# Patient Record
Sex: Male | Born: 1980 | Race: White | Hispanic: No | Marital: Married
Health system: Southern US, Community
[De-identification: ages and names within clinical notes are randomized; demographics above are authoritative.]

## PROBLEM LIST (undated history)

## (undated) DIAGNOSIS — I10 Essential (primary) hypertension: Secondary | ICD-10-CM

---

## 2019-12-09 ENCOUNTER — Emergency Department (HOSPITAL_COMMUNITY): Payer: Managed Care, Other (non HMO)

## 2019-12-09 ENCOUNTER — Other Ambulatory Visit: Payer: Self-pay

## 2019-12-09 ENCOUNTER — Emergency Department (HOSPITAL_COMMUNITY)
Admission: EM | Admit: 2019-12-09 | Discharge: 2019-12-09 | Disposition: A | Discharge status: Home / Self Care | Payer: Managed Care, Other (non HMO) | Attending: Emergency Medicine | Admitting: Emergency Medicine

## 2019-12-09 ENCOUNTER — Encounter (HOSPITAL_COMMUNITY): Payer: Self-pay | Admitting: Emergency Medicine

## 2019-12-09 DIAGNOSIS — N5089 Other specified disorders of the male genital organs: Secondary | ICD-10-CM | POA: Diagnosis present

## 2019-12-09 DIAGNOSIS — N50811 Right testicular pain: Secondary | ICD-10-CM | POA: Diagnosis not present

## 2019-12-09 DIAGNOSIS — N5082 Scrotal pain: Secondary | ICD-10-CM

## 2019-12-09 DIAGNOSIS — I10 Essential (primary) hypertension: Secondary | ICD-10-CM | POA: Diagnosis not present

## 2019-12-09 HISTORY — DX: Essential (primary) hypertension: I10

## 2019-12-09 LAB — URINALYSIS, ROUTINE W REFLEX MICROSCOPIC
Bilirubin Urine: NEGATIVE
Glucose, UA: NEGATIVE mg/dL
Hgb urine dipstick: NEGATIVE
Ketones, ur: NEGATIVE mg/dL
Leukocytes,Ua: NEGATIVE
Nitrite: NEGATIVE
Protein, ur: NEGATIVE mg/dL
Specific Gravity, Urine: 1.018 (ref 1.005–1.030)
pH: 5 (ref 5.0–8.0)

## 2019-12-09 MED ORDER — NAPROXEN 500 MG PO TABS: 500.0000 mg | ORAL_TABLET | Freq: Two times a day (BID) | ORAL | 0 refills | Status: AC

## 2019-12-09 NOTE — ED Provider Notes (Signed)
[facility] DEPT Provider Note   CSN: [id number] Arrival date & time: 12/09/19  1206     History Chief Complaint  Patient presents with  . Testicle Pain    Jeremy Hoffman is a 39 y.o. male.  HPI    39 year old male comes in a chief complaint of scrotal irritation. Patient reports that for the last several weeks has been having some off-and-on irritation over his scrotal region.  He attributed to the discomfort to his exercise.  Over the last 4 to 5 days his symptoms have become more constant and prominent.  His symptoms are worse with any kind of movement.  He has relaxed activity but his symptoms have persisted.  Patient denies any trauma.  There is no UTI-like symptoms.  He has no penile discharge.  Patient has no family history of testicular tumor in the family.   Past Medical History:  Diagnosis Date  . Hypertension     There are no problems to display for this patient.       No family history on file.  Social History   Tobacco Use  . Smoking status: Not on file  Substance Use Topics  . Alcohol use: Not on file  . Drug use: Not on file    Home Medications Prior to Admission medications   Not on File    Allergies    Patient has no known allergies.  Review of Systems   Review of Systems  Constitutional: Positive for activity change.  Gastrointestinal: Negative for abdominal pain, nausea and vomiting.  Genitourinary: Positive for scrotal swelling. Negative for decreased urine volume.  Allergic/Immunologic: Negative for immunocompromised state.  Hematological: Does not bruise/bleed easily.  All other systems reviewed and are negative.   Physical Exam Updated Vital Signs BP (!) 152/101 (BP Location: Left Arm)   Pulse 72   Temp 97.7 F (36.5 C) (Oral)   Resp 19   SpO2 96%   Physical Exam Vitals and nursing note reviewed.  Constitutional:      Appearance: He is well-developed.  HENT:     Head: Atraumatic.    Cardiovascular:     Rate and Rhythm: Normal rate.  Pulmonary:     Effort: Pulmonary effort is normal.  Genitourinary:    Comments: Patient has no significant edema, erythema, induration over the right scrotum.  No signs of inguinal hernia. Musculoskeletal:     Cervical back: Neck supple.  Skin:    General: Skin is warm.  Neurological:     Mental Status: He is alert and oriented to person, place, and time.     ED Results / Procedures / Treatments   Labs (all labs ordered are listed, but only abnormal results are displayed) Labs Reviewed  URINALYSIS, ROUTINE W REFLEX MICROSCOPIC    EKG None  Radiology US SCROTUM W/DOPPLER  Result Date: 12/09/2019 CLINICAL DATA:  39 year old with right scrotal pain for 3 weeks. EXAM: SCROTAL ULTRASOUND DOPPLER ULTRASOUND OF THE TESTICLES TECHNIQUE: Complete ultrasound examination of the testicles, epididymis, and other scrotal structures was performed. Color and spectral Doppler ultrasound were also utilized to evaluate blood flow to the testicles. COMPARISON:  None. FINDINGS: Right testicle Measurements: 4.7 x 2.5 x 2.9 cm. Homogeneous echogenicity. Normal blood flow. No mass or microlithiasis visualized. Left testicle Measurements: 4.5 x 2.3 x 3.2 cm. Homogeneous echogenicity. Normal blood flow. No mass or microlithiasis visualized. Right epididymis:  Normal in size and appearance.  No hyperemia. Left epididymis: Normal in size. There is a 1.3 cm  testicular head cyst. No hyperemia. Hydrocele:  Small bilateral. Varicocele: None by size criteria. There is prominent left para scrotal vascularity without frank varicocele. Pulsed Doppler interrogation of both testes demonstrates normal low resistance arterial and venous waveforms bilaterally. IMPRESSION: 1. Normal sonographic appearance of both testis. No testicular torsion. 2. Small bilateral hydroceles. 3. No definite varicocele, however there is prominent para scrotal vascularity on the left with Valsalva,  of doubtful clinical significance in the setting of right scrotal pain. No testicular or epididymal hyperemia. Electronically Signed   By: Narda Rutherford M.D.   On: 12/09/2019 14:02    Procedures Procedures (including critical care time)  Medications Ordered in ED Medications - No data to display  ED Course  I have reviewed the triage vital signs and the nursing notes.  Pertinent labs & imaging results that were available during my care of the patient were reviewed by me and considered in my medical decision making (see chart for details).    MDM Rules/Calculators/A&P                      39 year old comes in a chief complaint of scrotal pain. Pain has been present for the last several weeks and has intensified over the last few days. Exam does not reveal any evidence of hernia, abscess/infection. UA is clean.  Ultrasound ordered and it does not reveal any evidence of epididymitis.  Patient is in a monogamous relationship with his wife, therefore doubt STDs.  We will advise him to follow-up with PCP or urologist for further evaluation.  NSAID started.  Final Clinical Impression(s) / ED Diagnoses Final diagnoses:  Scrotal pain    Rx / DC Orders ED Discharge Orders    None       Derwood Kaplan, MD 12/09/19 1514

## 2019-12-09 NOTE — ED Triage Notes (Signed)
Patient reports right testicle pain x3 weeks. Reports exercising daily. States pain radiates down right left intermittently. Seen at [facility] and sent for further evaluation. Denies urinary symptoms and penile discharge.

## 2019-12-09 NOTE — Discharge Instructions (Signed)
We saw you in the ER for testicle/scrotal pain. All the results in the ER are normal, labs and imaging. We are not sure what is causing your symptoms. The workup in the ER is not complete, and is limited to screening for life threatening and emergent conditions only, so please see a primary care doctor or urologist for further evaluation.

## 2021-03-04 IMAGING — US US SCROTUM W/ DOPPLER COMPLETE
1 series · 13 of 25 positions shown · non-contrast
Comparison: None.

CLINICAL DATA: 38-year-old with right scrotal pain for 3 weeks.

EXAM:
SCROTAL ULTRASOUND
DOPPLER ULTRASOUND OF THE TESTICLES
TECHNIQUE: Complete ultrasound examination of the testicles, epididymis, and
other scrotal structures was performed. Color and spectral Doppler
ultrasound were also utilized to evaluate blood flow to the
testicles.

[Series 1: us scrotum w/ doppler complete · 13 of 103 slices shown]
[im 1/103]
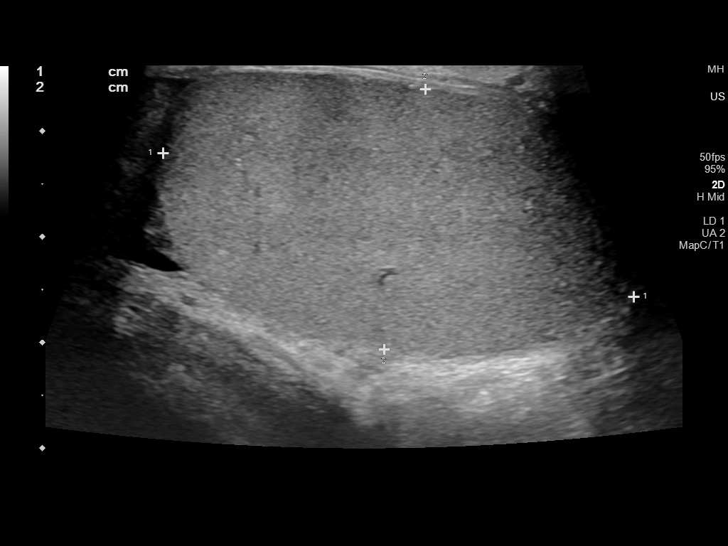
[im 9/103]
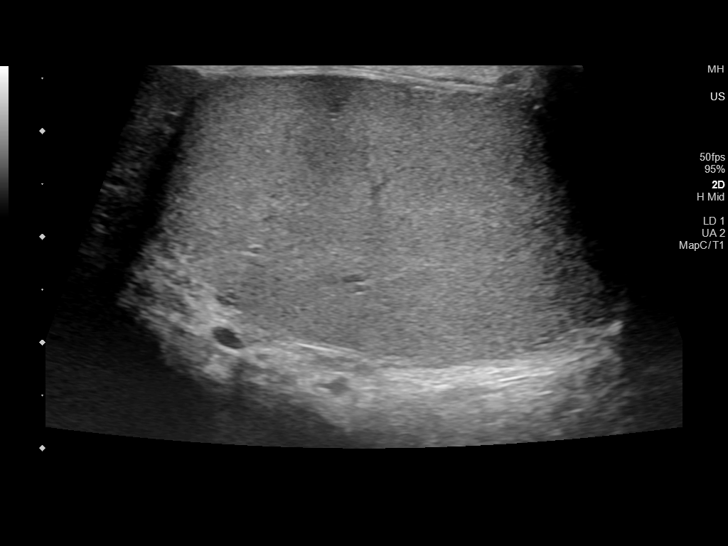
[im 18/103]
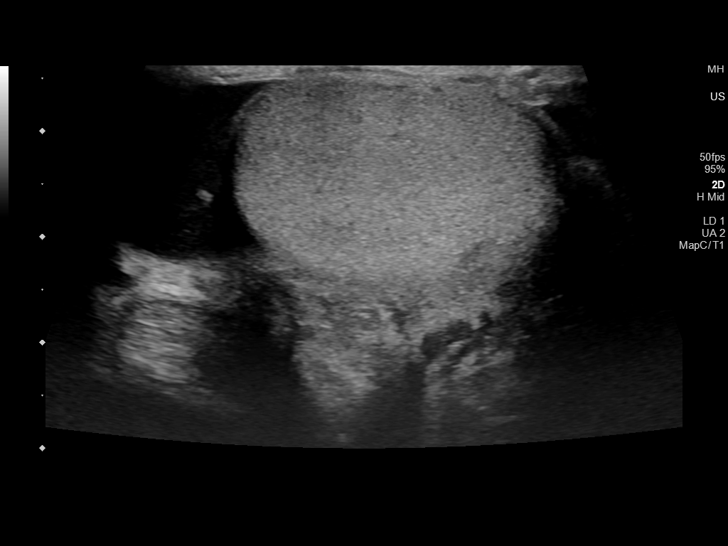
[im 26/103]
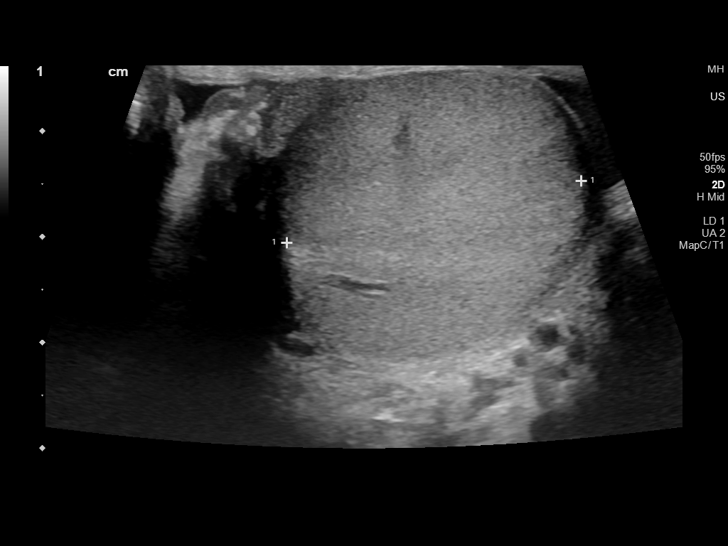
[im 35/103]
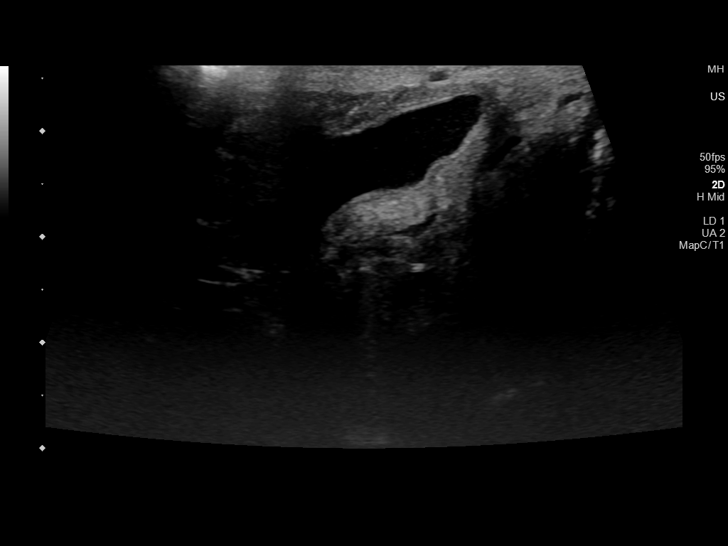
[im 43/103]
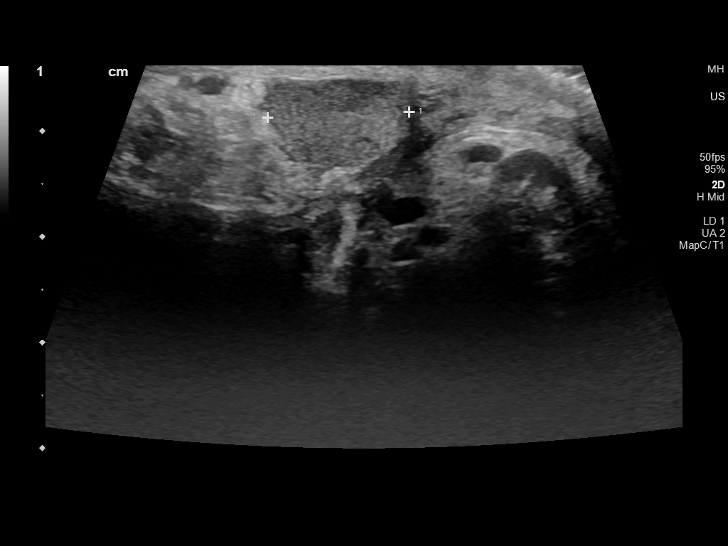
[im 52/103]
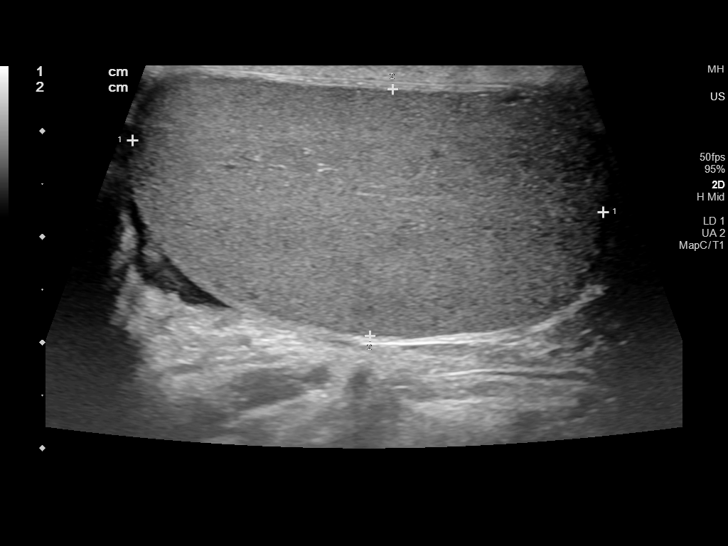
[im 60/103]
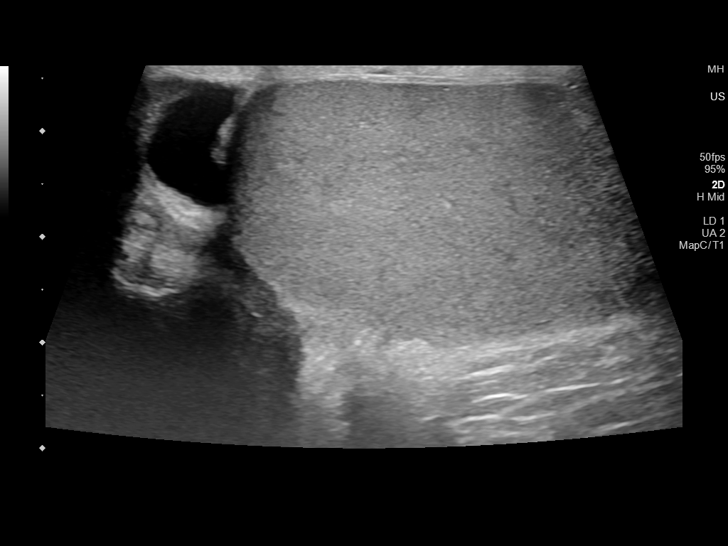
[im 69/103]
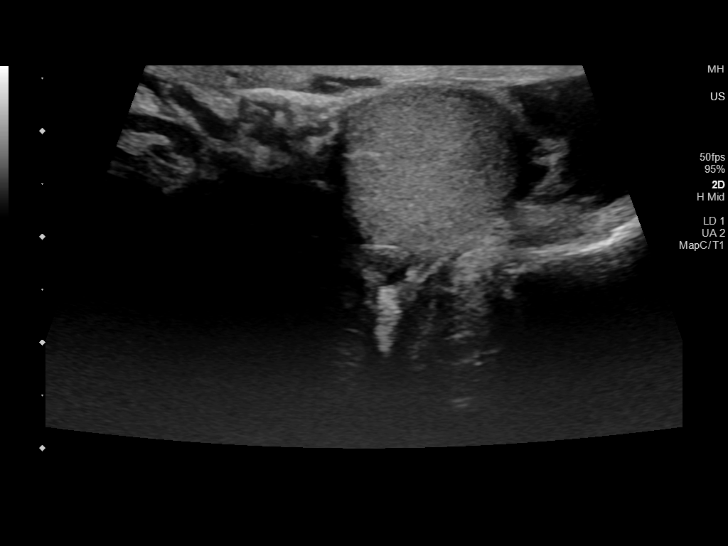
[im 77/103]
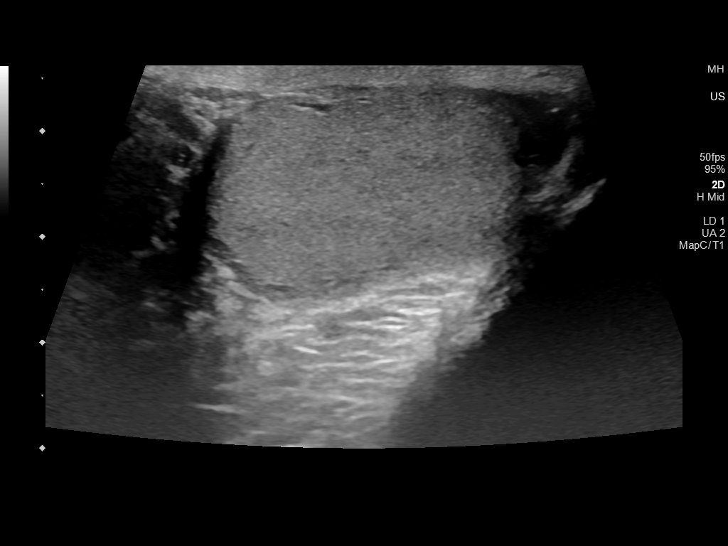
[im 86/103]
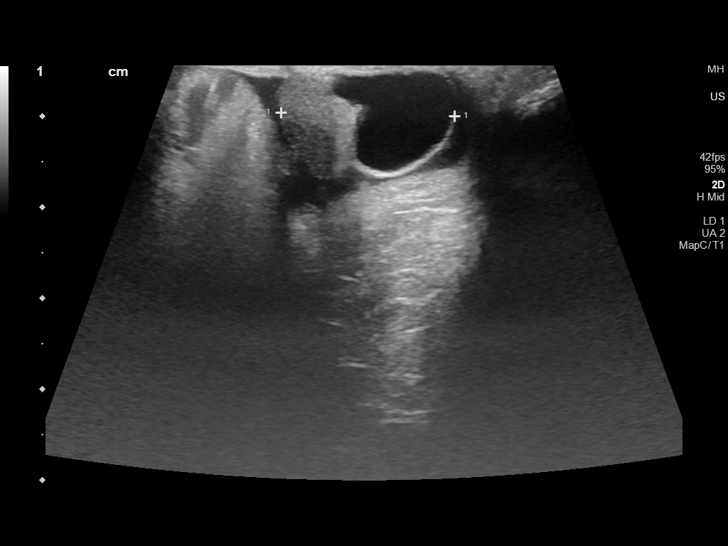
[im 94/103]
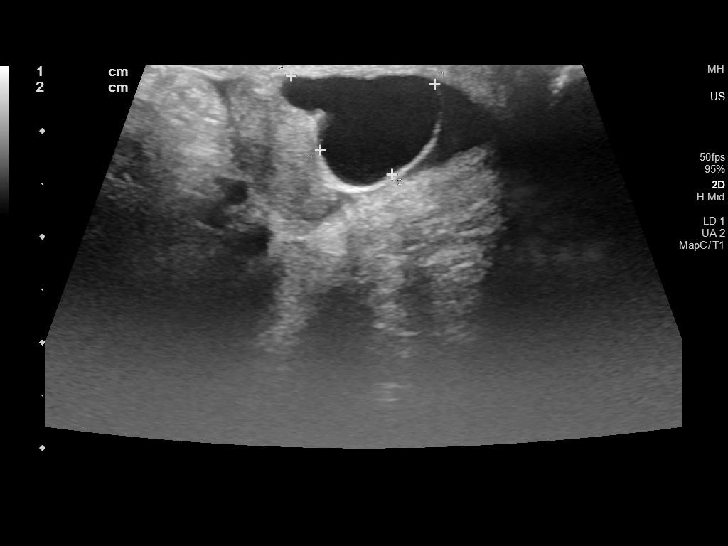
[im 103/103]
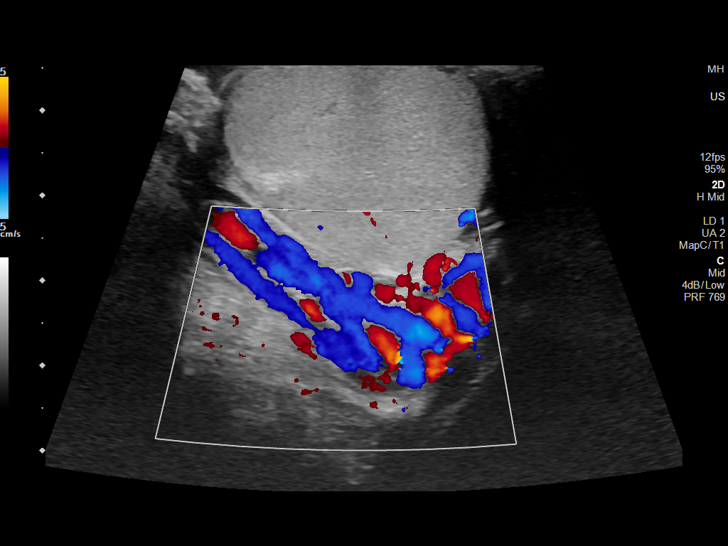

[13 of 25 positions shown; findings below may reference images not displayed]

FINDINGS: Right testicle

Measurements: 4.7 x 2.5 x 2.9 cm. Homogeneous echogenicity. Normal
blood flow. No mass or microlithiasis visualized.

Left testicle

Measurements: 4.5 x 2.3 x 3.2 cm. Homogeneous echogenicity. Normal
blood flow. No mass or microlithiasis visualized.

Right epididymis:  Normal in size and appearance.  No hyperemia.

Left epididymis: Normal in size. There is a 1.3 cm testicular head
cyst. No hyperemia.

Hydrocele:  Small bilateral.

Varicocele: None by size criteria. There is prominent left para
scrotal vascularity without frank varicocele.

Pulsed Doppler interrogation of both testes demonstrates normal low
resistance arterial and venous waveforms bilaterally.
IMPRESSION: 1. Normal sonographic appearance of both testis. No testicular
torsion.
2. Small bilateral hydroceles.
3. No definite varicocele, however there is prominent para scrotal
vascularity on the left with Valsalva, of doubtful clinical
significance in the setting of right scrotal pain. No testicular or
epididymal hyperemia.
